# Patient Record
Sex: Male | Born: 1975 | Race: White | Hispanic: No | Marital: Married | Smoking: Current every day smoker
Health system: Southern US, Community
[De-identification: ages and names within clinical notes are randomized; demographics above are authoritative.]

## PROBLEM LIST (undated history)

## (undated) DIAGNOSIS — Z720 Tobacco use: Secondary | ICD-10-CM

## (undated) DIAGNOSIS — F1010 Alcohol abuse, uncomplicated: Secondary | ICD-10-CM

## (undated) DIAGNOSIS — D7589 Other specified diseases of blood and blood-forming organs: Secondary | ICD-10-CM

## (undated) HISTORY — DX: Other specified diseases of blood and blood-forming organs: D75.89

## (undated) HISTORY — DX: Alcohol abuse, uncomplicated: F10.10

## (undated) HISTORY — DX: Tobacco use: Z72.0

---

## 2014-08-12 ENCOUNTER — Emergency Department: Payer: Self-pay | Admitting: Emergency Medicine

## 2014-08-12 LAB — CBC
HCT: 47.3 % (ref 40.0–52.0)
HGB: 16 g/dL (ref 13.0–18.0)
MCH: 33.5 pg (ref 26.0–34.0)
MCHC: 33.9 g/dL (ref 32.0–36.0)
MCV: 99 fL (ref 80–100)
PLATELETS: 201 10*3/uL (ref 150–440)
RBC: 4.78 10*6/uL (ref 4.40–5.90)
RDW: 13.2 % (ref 11.5–14.5)
WBC: 5.2 10*3/uL (ref 3.8–10.6)

## 2014-08-12 LAB — COMPREHENSIVE METABOLIC PANEL
ANION GAP: 7 (ref 7–16)
Albumin: 4.1 g/dL (ref 3.4–5.0)
Alkaline Phosphatase: 44 U/L — ABNORMAL LOW
BILIRUBIN TOTAL: 1.1 mg/dL — AB (ref 0.2–1.0)
BUN: 14 mg/dL (ref 7–18)
Calcium, Total: 9.4 mg/dL (ref 8.5–10.1)
Chloride: 106 mmol/L (ref 98–107)
Co2: 21 mmol/L (ref 21–32)
Creatinine: 1.01 mg/dL (ref 0.60–1.30)
EGFR (Non-African Amer.): >60
Glucose: 103 mg/dL — ABNORMAL HIGH (ref 65–99)
OSMOLALITY: 269 (ref 275–301)
Potassium: 4.1 mmol/L (ref 3.5–5.1)
SGOT(AST): 26 U/L (ref 15–37)
SGPT (ALT): 49 U/L
Sodium: 134 mmol/L — ABNORMAL LOW (ref 136–145)
Total Protein: 7.6 g/dL (ref 6.4–8.2)

## 2014-08-12 LAB — TROPONIN I: Troponin-I: <0.02 ng/mL

## 2015-02-07 ENCOUNTER — Ambulatory Visit: Payer: Self-pay | Admitting: Family Medicine

## 2015-08-29 DIAGNOSIS — F1010 Alcohol abuse, uncomplicated: Secondary | ICD-10-CM | POA: Insufficient documentation

## 2015-08-29 DIAGNOSIS — D7589 Other specified diseases of blood and blood-forming organs: Secondary | ICD-10-CM | POA: Insufficient documentation

## 2015-08-29 DIAGNOSIS — Z720 Tobacco use: Secondary | ICD-10-CM | POA: Insufficient documentation

## 2015-08-31 ENCOUNTER — Ambulatory Visit (INDEPENDENT_AMBULATORY_CARE_PROVIDER_SITE_OTHER): Payer: BLUE CROSS/BLUE SHIELD | Admitting: Family Medicine

## 2015-08-31 ENCOUNTER — Encounter: Payer: Self-pay | Admitting: Family Medicine

## 2015-08-31 VITALS — BP 114/76 | HR 62 | Temp 97.4°F | Ht 72.0 in | Wt 192.0 lb

## 2015-08-31 DIAGNOSIS — Z720 Tobacco use: Secondary | ICD-10-CM

## 2015-08-31 DIAGNOSIS — M7022 Olecranon bursitis, left elbow: Secondary | ICD-10-CM | POA: Diagnosis not present

## 2015-08-31 DIAGNOSIS — B999 Unspecified infectious disease: Secondary | ICD-10-CM | POA: Diagnosis not present

## 2015-08-31 DIAGNOSIS — S60929A Unspecified superficial injury of unspecified hand, initial encounter: Secondary | ICD-10-CM

## 2015-08-31 DIAGNOSIS — S60922A Unspecified superficial injury of left hand, initial encounter: Secondary | ICD-10-CM | POA: Diagnosis not present

## 2015-08-31 DIAGNOSIS — R740 Nonspecific elevation of levels of transaminase and lactic acid dehydrogenase [LDH]: Secondary | ICD-10-CM | POA: Diagnosis not present

## 2015-08-31 DIAGNOSIS — R7401 Elevation of levels of liver transaminase levels: Secondary | ICD-10-CM

## 2015-08-31 DIAGNOSIS — Z23 Encounter for immunization: Secondary | ICD-10-CM

## 2015-08-31 DIAGNOSIS — L089 Local infection of the skin and subcutaneous tissue, unspecified: Secondary | ICD-10-CM

## 2015-08-31 NOTE — Assessment & Plan Note (Signed)
Tetanus booster offered and given today

## 2015-08-31 NOTE — Progress Notes (Signed)
BP 114/76 mmHg  Pulse 62  Temp(Src) 97.4 F (36.3 C)  Ht 6' (1.829 m)  Wt 192 lb (87.091 kg)  BMI 26.03 kg/m2  SpO2 98%   Subjective:    Patient ID: Derrick Holloway, male    DOB: August 06, 1976, 39 y.o.   MRN: [id number]  HPI: Derrick Holloway is a 39 y.o. male  Chief Complaint  Patient presents with  . Elbow Infection    It is the left elbow x 6 weeks. He went to Urgent care and they sent him to Ortho. Ortho prescribed him some meds and wanted to wait on draining it. He's finished the meds he got and still having issues with it.    He went to urgent care 6-8 weeks ago, left elbow got infected; no cut; swelled up and got hot; they took xrays and gave him medicines; did not get any better, then he saw orthopaedist and they put him on medicine; they did not want it drain it after all; pain is gone; still a little swollen and little red  Now he notices that sores are not healing; no fevers, no red streaks; gets cut on his hands and they don't heal  No known MRSA; wife works in a hospital; daughter had infection in her ankle, maybe MRSA but he's not sure Typical American diet, not a lot of vegetables; he does not take multiple vitamin  No known family hx of diabetes  Relevant past medical, surgical, family and social history reviewed and updated as indicated. Interim medical history since our last visit reviewed. Allergies and medications reviewed and updated.  Still smoking; tried chantix, made him too sick; 1 ppd to 1.5 ppd; managed to quit a few weeks Drinks alcohol but drinks less than 14 drinks per week  Review of Systems  Constitutional: Negative for unexpected weight change.  Endocrine: Negative for polydipsia, polyphagia and polyuria.    Per HPI unless specifically indicated above     Objective:    BP 114/76 mmHg  Pulse 62  Temp(Src) 97.4 F (36.3 C)  Ht 6' (1.829 m)  Wt 192 lb (87.091 kg)  BMI 26.03 kg/m2  SpO2 98%  Wt Readings from Last 3  Encounters:  08/31/15 192 lb (87.091 kg)  08/29/15 200 lb (90.719 kg)    Physical Exam  Constitutional: He appears well-developed and well-nourished. No distress.  Cardiovascular: Normal rate.   Musculoskeletal:       Left forearm: He exhibits swelling and edema. He exhibits no bony tenderness and no deformity.  Soft erythematous swelling over the left olecranon; full ROM with flexion and extension  Skin: He is not diaphoretic.  Ruddy complexion; two sores on the extensor surface of the left fingers (3rd and 5th fingers); no purulence; lesion on 3rd finger older than on 5th finger; no red streaks proximally from fingers/hands       Assessment & Plan:   Problem List Items Addressed This Visit      Musculoskeletal and Integument   Superficial injury of hand with infection - Primary    Tetanus booster offered and given today      Relevant Orders   Tdap vaccine greater than or equal to 7yo IM   CBC with Differential/Platelet   MRSA culture   Olecranon bursitis of left elbow    Improving; he has been seen twice at urgent care and once by ortho; no new medicines today; did advise he alleviate/prevent pressure against that elbow  Other   Tobacco abuse    Talked with patient about smoking cessation; avoid the electronic vape cigs that have flavorings, as those flavorings may cause bronchioloitis obliterans; discussed Chantix which he does not want to use again; offered wellbutrin, and he will think about that but did not want Rx today; he is welcome to call and I'll Rx that if he decides he would like to try it in the near future; tips for success given in the after visit summary      Recurrent infections    Discussed ddx; offered HIV testing but he did not seem interested; will get CBC and MRSA nasal swab screen and glucose on Monday; want to r/o diabetes, r/o neutropenia, r/o MRSA colonization      Relevant Orders   CBC with Differential/Platelet   MRSA culture   Elevated  serum glutamic pyruvic transaminase (SGPT) level    Recheck SGPT when he returns for labs on Monday; he reports drinking less than the max recommended amount for men      Relevant Orders   Comprehensive metabolic panel      Follow up plan: Return in about 4 days (around 09/04/2015) for fasting labs on Monday.

## 2015-08-31 NOTE — Assessment & Plan Note (Signed)
Recheck SGPT when he returns for labs on Monday; he reports drinking less than the max recommended amount for men

## 2015-08-31 NOTE — Assessment & Plan Note (Signed)
Talked with patient about smoking cessation; avoid the electronic vape cigs that have flavorings, as those flavorings may cause bronchioloitis obliterans; discussed Chantix which he does not want to use again; offered wellbutrin, and he will think about that but did not want Rx today; he is welcome to call and I'll Rx that if he decides he would like to try it in the near future; tips for success given in the after visit summary

## 2015-08-31 NOTE — Patient Instructions (Addendum)
Some morning soon, return for fasting labs You received the vaccine to protect against tetanus and diphtheria and pertussis today; the tetanus and diphtheria portions will provide protection up to ten years, and the pertussis component will give you protection against whooping cough for life Pick up a multiple vitamin and start taking one a day Think about using Wellbutrin or Zyban (generic is bupropion) for smoking cessation to help you   Smoking Cessation, Tips for Success If you are ready to quit smoking, congratulations! You have chosen to help yourself be healthier. Cigarettes bring nicotine, tar, carbon monoxide, and other irritants into your body. Your lungs, heart, and blood vessels will be able to work better without these poisons. There are many different ways to quit smoking. Nicotine gum, nicotine patches, a nicotine inhaler, or nicotine nasal spray can help with physical craving. Hypnosis, support groups, and medicines help break the habit of smoking. WHAT THINGS CAN I DO TO MAKE QUITTING EASIER?  Here are some tips to help you quit for good:  Pick a date when you will quit smoking completely. Tell all of your friends and family about your plan to quit on that date.  Do not try to slowly cut down on the number of cigarettes you are smoking. Pick a quit date and quit smoking completely starting on that day.  Throw away all cigarettes.   Clean and remove all ashtrays from your home, work, and car.  On a card, write down your reasons for quitting. Carry the card with you and read it when you get the urge to smoke.  Cleanse your body of nicotine. Drink enough water and fluids to keep your urine clear or pale yellow. Do this after quitting to flush the nicotine from your body.  Learn to predict your moods. Do not let a bad situation be your excuse to have a cigarette. Some situations in your life might tempt you into wanting a cigarette.  Never have "just one" cigarette. It leads to  wanting another and another. Remind yourself of your decision to quit.  Change habits associated with smoking. If you smoked while driving or when feeling stressed, try other activities to replace smoking. Stand up when drinking your coffee. Brush your teeth after eating. Sit in a different chair when you read the paper. Avoid alcohol while trying to quit, and try to drink fewer caffeinated beverages. Alcohol and caffeine may urge you to smoke.  Avoid foods and drinks that can trigger a desire to smoke, such as sugary or spicy foods and alcohol.  Ask people who smoke not to smoke around you.  Have something planned to do right after eating or having a cup of coffee. For example, plan to take a walk or exercise.  Try a relaxation exercise to calm you down and decrease your stress. Remember, you may be tense and nervous for the first 2 weeks after you quit, but this will pass.  Find new activities to keep your hands busy. Play with a pen, coin, or rubber band. Doodle or draw things on paper.  Brush your teeth right after eating. This will help cut down on the craving for the taste of tobacco after meals. You can also try mouthwash.   Use oral substitutes in place of cigarettes. Try using lemon drops, carrots, cinnamon sticks, or chewing gum. Keep them handy so they are available when you have the urge to smoke.  When you have the urge to smoke, try deep breathing.  Designate your  home as a nonsmoking area.  If you are a heavy smoker, ask your health care provider about a prescription for nicotine chewing gum. It can ease your withdrawal from nicotine.  Reward yourself. Set aside the cigarette money you save and buy yourself something nice.  Look for support from others. Join a support group or smoking cessation program. Ask someone at home or at work to help you with your plan to quit smoking.  Always ask yourself, "Do I need this cigarette or is this just a reflex?" Tell yourself, "Today,  I choose not to smoke," or "I do not want to smoke." You are reminding yourself of your decision to quit.  Do not replace cigarette smoking with electronic cigarettes (commonly called e-cigarettes). The safety of e-cigarettes is unknown, and some may contain harmful chemicals.  If you relapse, do not give up! Plan ahead and think about what you will do the next time you get the urge to smoke. HOW WILL I FEEL WHEN I QUIT SMOKING? You may have symptoms of withdrawal because your body is used to nicotine (the addictive substance in cigarettes). You may crave cigarettes, be irritable, feel very hungry, cough often, get headaches, or have difficulty concentrating. The withdrawal symptoms are only temporary. They are strongest when you first quit but will go away within 10-14 days. When withdrawal symptoms occur, stay in control. Think about your reasons for quitting. Remind yourself that these are signs that your body is healing and getting used to being without cigarettes. Remember that withdrawal symptoms are easier to treat than the major diseases that smoking can cause.  Even after the withdrawal is over, expect periodic urges to smoke. However, these cravings are generally short lived and will go away whether you smoke or not. Do not smoke! WHAT RESOURCES ARE AVAILABLE TO HELP ME QUIT SMOKING? Your health care provider can direct you to community resources or hospitals for support, which may include:  Group support.  Education.  Hypnosis.  Therapy. Document Released: 08/16/2004 Document Revised: 04/04/2014 Document Reviewed: 05/06/2013 [facility] Patient Information 2015 [place], [place]. This information is not intended to replace advice given to you by your health care provider. Make sure you discuss any questions you have with your health care provider.

## 2015-08-31 NOTE — Assessment & Plan Note (Signed)
Improving; he has been seen twice at urgent care and once by ortho; no new medicines today; did advise he alleviate/prevent pressure against that elbow

## 2015-08-31 NOTE — Assessment & Plan Note (Signed)
Discussed ddx; offered HIV testing but he did not seem interested; will get CBC and MRSA nasal swab screen and glucose on Monday; want to r/o diabetes, r/o neutropenia, r/o MRSA colonization

## 2015-09-04 ENCOUNTER — Other Ambulatory Visit: Payer: Self-pay | Admitting: Family Medicine

## 2015-09-04 ENCOUNTER — Other Ambulatory Visit: Payer: BLUE CROSS/BLUE SHIELD

## 2015-09-04 DIAGNOSIS — B999 Unspecified infectious disease: Secondary | ICD-10-CM

## 2015-09-04 NOTE — Assessment & Plan Note (Signed)
New order for MRSA swab entered

## 2015-09-05 ENCOUNTER — Encounter: Payer: Self-pay | Admitting: Family Medicine

## 2015-09-05 LAB — COMPREHENSIVE METABOLIC PANEL
A/G RATIO: 1.7 (ref 1.1–2.5)
ALK PHOS: 47 IU/L (ref 39–117)
ALT: 32 IU/L (ref 0–44)
AST: 27 IU/L (ref 0–40)
Albumin: 4.3 g/dL (ref 3.5–5.5)
BUN/Creatinine Ratio: 18 (ref 8–19)
BUN: 19 mg/dL (ref 6–20)
Bilirubin Total: 0.9 mg/dL (ref 0.0–1.2)
CO2: 22 mmol/L (ref 18–29)
CREATININE: 1.08 mg/dL (ref 0.76–1.27)
Calcium: 9.6 mg/dL (ref 8.7–10.2)
Chloride: 100 mmol/L (ref 97–108)
GFR calc Af Amer: 102 mL/min/{1.73_m2} (ref >59)
GFR calc non Af Amer: 88 mL/min/{1.73_m2} (ref >59)
GLOBULIN, TOTAL: 2.5 g/dL (ref 1.5–4.5)
Glucose: 99 mg/dL (ref 65–99)
POTASSIUM: 4.7 mmol/L (ref 3.5–5.2)
SODIUM: 139 mmol/L (ref 134–144)
Total Protein: 6.8 g/dL (ref 6.0–8.5)

## 2015-09-05 LAB — CBC WITH DIFFERENTIAL/PLATELET
Basophils Absolute: 0 10*3/uL (ref 0.0–0.2)
Basos: 0 %
EOS (ABSOLUTE): 0.1 10*3/uL (ref 0.0–0.4)
EOS: 2 %
HEMATOCRIT: 45.6 % (ref 37.5–51.0)
Hemoglobin: 16.4 g/dL (ref 12.6–17.7)
Immature Grans (Abs): 0 10*3/uL (ref 0.0–0.1)
Immature Granulocytes: 0 %
LYMPHS ABS: 1.1 10*3/uL (ref 0.7–3.1)
Lymphs: 24 %
MCH: 34.2 pg — ABNORMAL HIGH (ref 26.6–33.0)
MCHC: 36 g/dL — AB (ref 31.5–35.7)
MCV: 95 fL (ref 79–97)
MONOS ABS: 0.7 10*3/uL (ref 0.1–0.9)
Monocytes: 15 %
NEUTROS PCT: 59 %
Neutrophils Absolute: 2.7 10*3/uL (ref 1.4–7.0)
PLATELETS: 211 10*3/uL (ref 150–379)
RBC: 4.79 x10E6/uL (ref 4.14–5.80)
RDW: 13.2 % (ref 12.3–15.4)
WBC: 4.6 10*3/uL (ref 3.4–10.8)

## 2015-09-07 LAB — MRSA CULTURE: MRSA Screen: NEGATIVE

## 2015-11-23 ENCOUNTER — Other Ambulatory Visit: Payer: Self-pay | Admitting: Family Medicine

## 2015-11-23 MED ORDER — CITALOPRAM HYDROBROMIDE 10 MG PO TABS: 10.0000 mg | ORAL_TABLET | Freq: Every day | ORAL | Status: AC

## 2015-11-23 NOTE — Telephone Encounter (Signed)
Thank you for the note; Rx sent

## 2015-11-23 NOTE — Telephone Encounter (Signed)
Pt called stated he needs a refill on Citaloeram. Pt stated this was prescribed by another physician but pt wants to know if Dr. Sanda Klein can call in refills. Pt instructed he should contact prescribing doctor as well. Please call pt with issues. Pharm is [facility] in [place]. Thanks.

## 2015-11-23 NOTE — Telephone Encounter (Signed)
Routing to provider. Looking at [place], it looks like Dr. Ubaldo Glassing (cardiology) had refilled it.

## 2016-03-06 DIAGNOSIS — L570 Actinic keratosis: Secondary | ICD-10-CM

## 2016-03-06 HISTORY — DX: Actinic keratosis: L57.0

## 2016-09-24 IMAGING — CR DG CHEST 2V
1 series · 2 of 2 positions shown · non-contrast
Comparison: None.

CLINICAL DATA: Cough for a week, intermittent shortness of breath
and wheezing, smoking history

EXAM:
CHEST  2 VIEW

[Series 1: pa · 0.17mm/px · 2 of 2 slices shown]
[im 1/2]
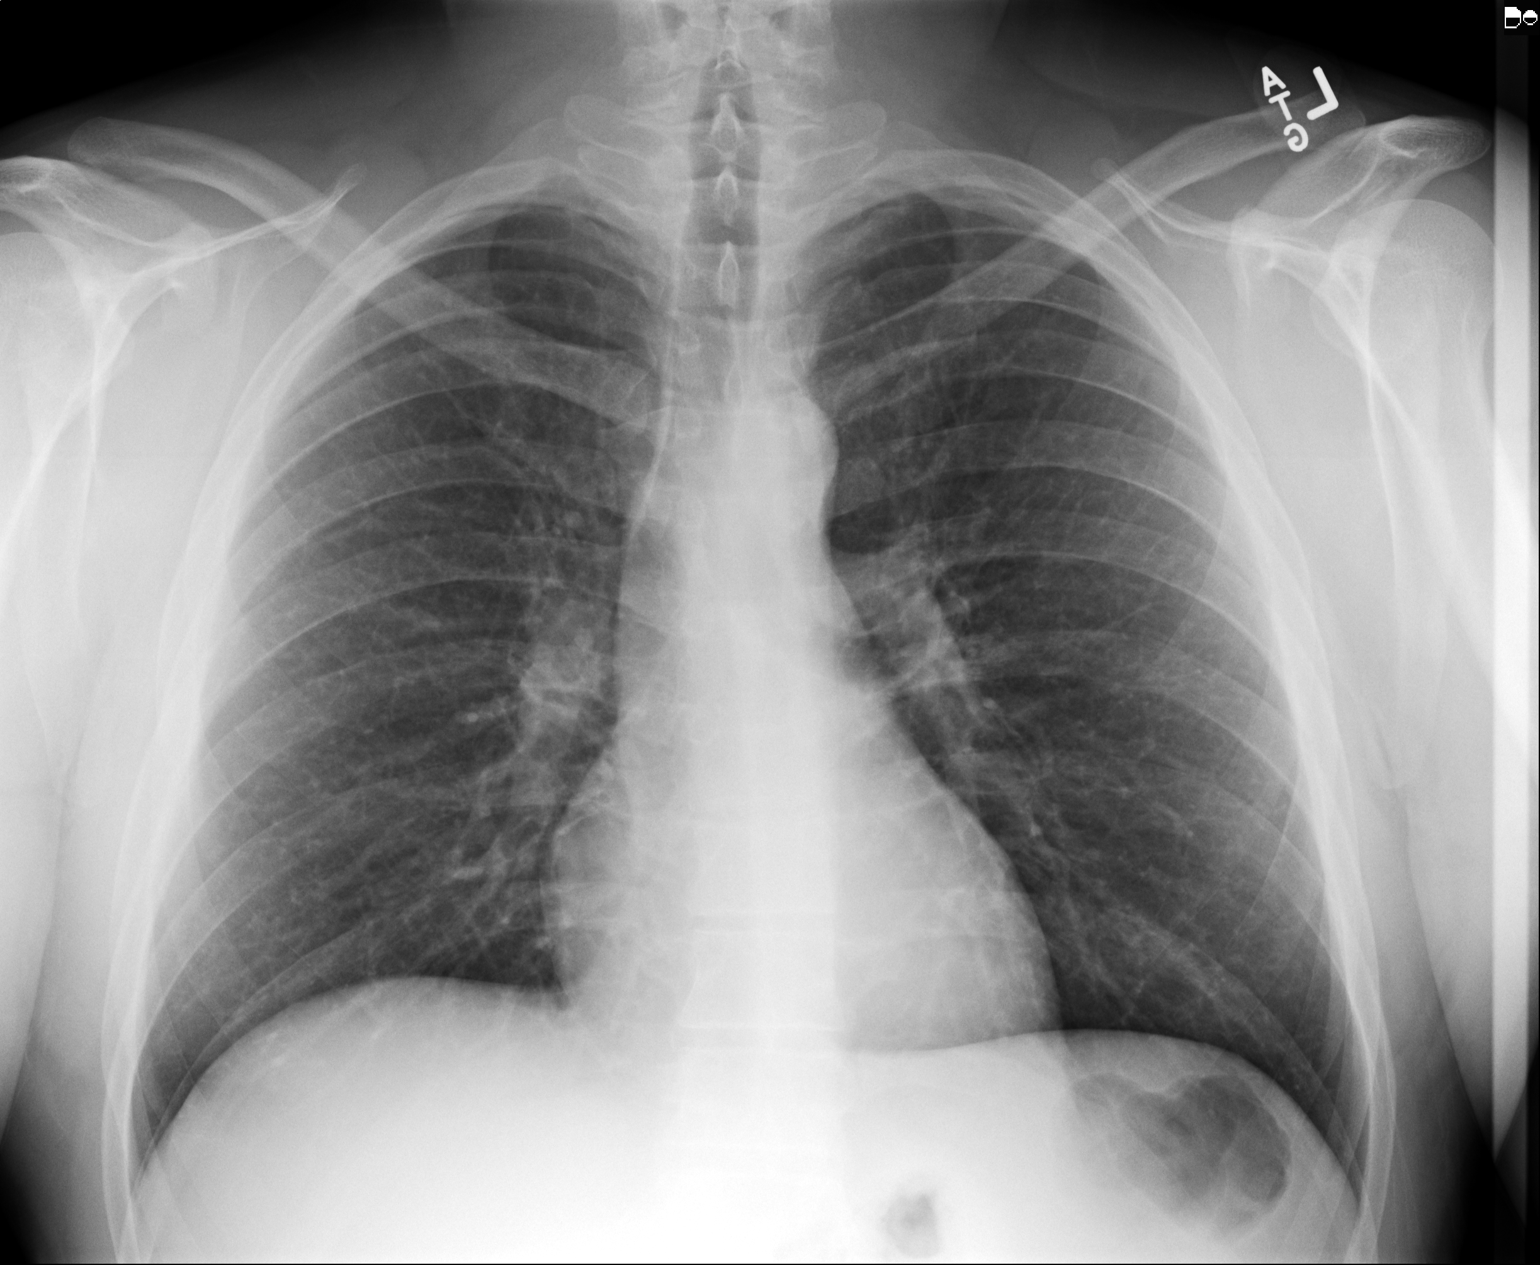
[im 2/2]
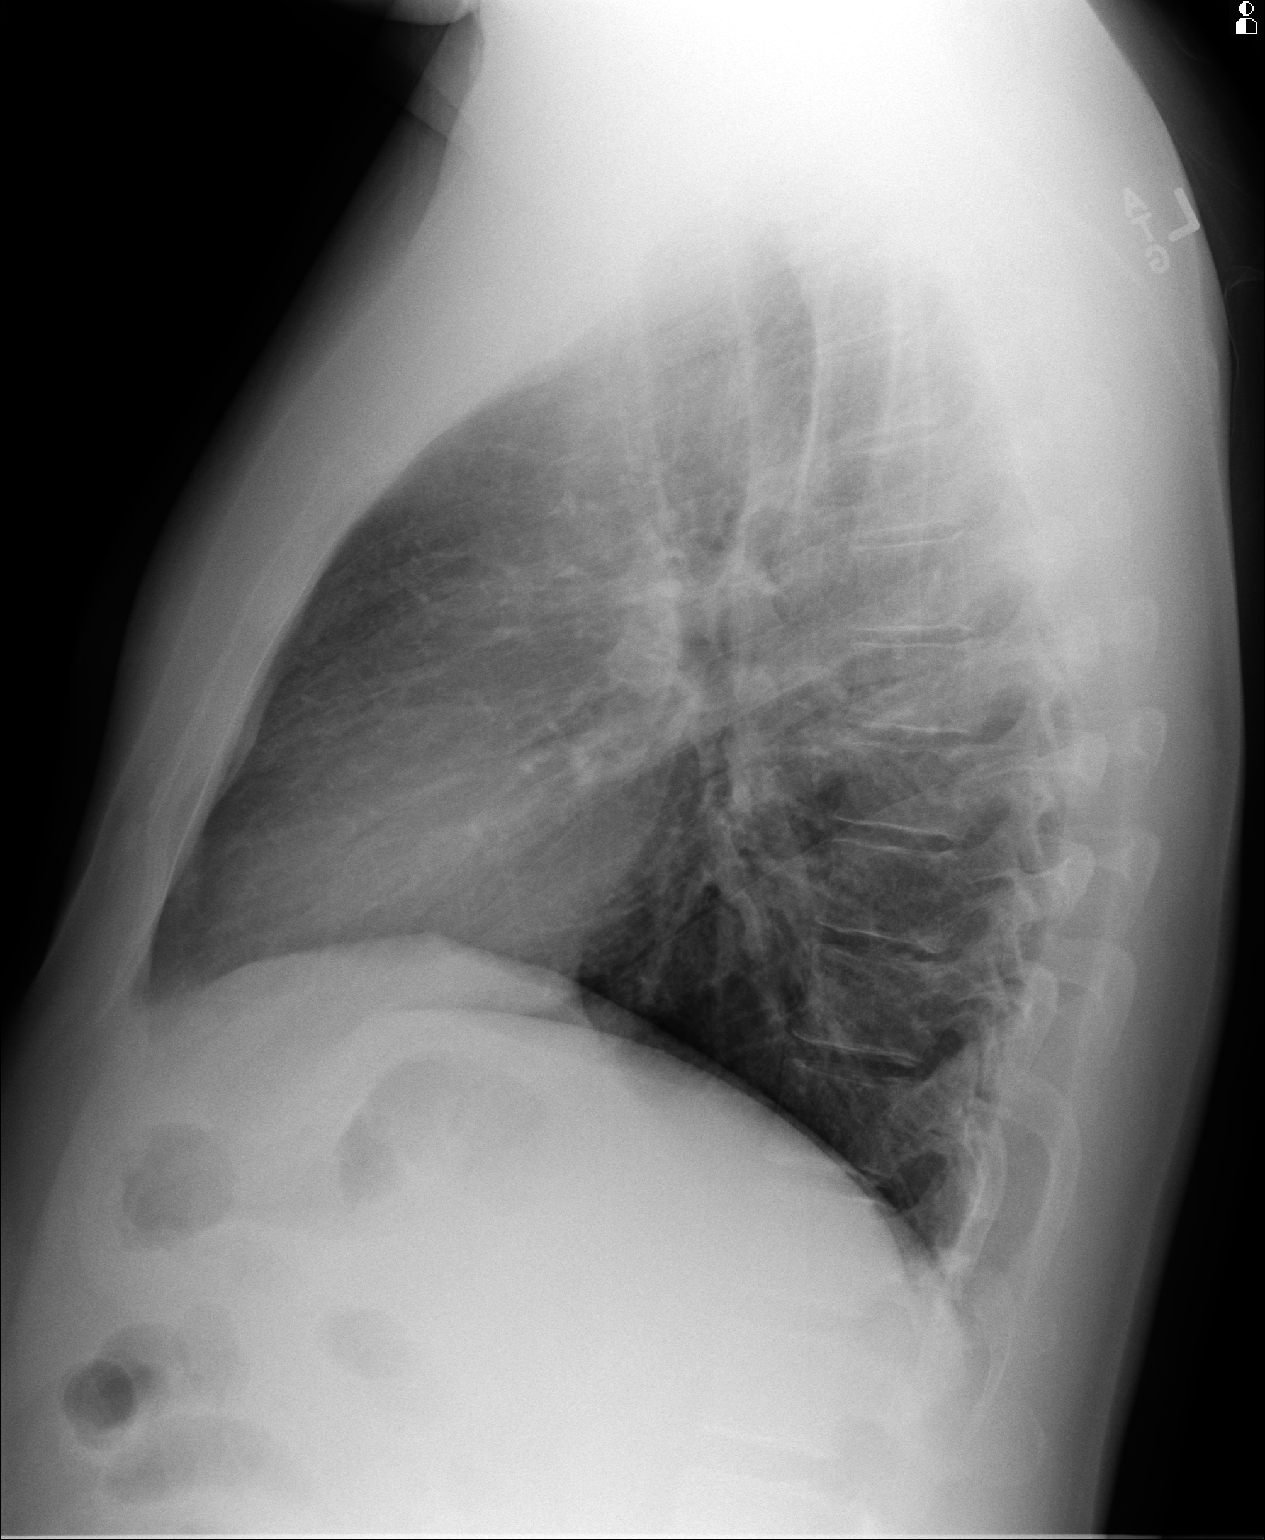

[2 of 2 positions shown; findings below may reference images not displayed]

FINDINGS: No infiltrate or effusion is seen. Mediastinal and hilar contours
are unremarkable. The heart is within normal limits in size. No bony
abnormality is seen.
IMPRESSION: No active cardiopulmonary disease.

## 2020-08-16 ENCOUNTER — Ambulatory Visit: Payer: BC Managed Care – PPO | Admitting: Dermatology

## 2020-08-16 ENCOUNTER — Other Ambulatory Visit: Payer: Self-pay

## 2020-08-16 ENCOUNTER — Encounter: Payer: Self-pay | Admitting: Dermatology

## 2020-08-16 DIAGNOSIS — L821 Other seborrheic keratosis: Secondary | ICD-10-CM | POA: Diagnosis not present

## 2020-08-16 DIAGNOSIS — L578 Other skin changes due to chronic exposure to nonionizing radiation: Secondary | ICD-10-CM | POA: Diagnosis not present

## 2020-08-16 DIAGNOSIS — L570 Actinic keratosis: Secondary | ICD-10-CM | POA: Diagnosis not present

## 2020-08-16 NOTE — Progress Notes (Deleted)
   New Patient Visit  Subjective  Derrick Holloway is a 44 y.o. male who presents for the following: Actinic Keratosis (Check a few pink scaly places on his face, pt report hx of Aks  ).   The following portions of the chart were reviewed this encounter and updated as appropriate:      Review of Systems:  No other skin or systemic complaints except as noted in HPI or Assessment and Plan.  Objective  Well appearing patient in no apparent distress; mood and affect are within normal limits.  A focused examination was performed including face. Relevant physical exam findings are noted in the Assessment and Plan.  Objective  Head - Anterior (Face) (3): Erythematous thin papules/macules with gritty scale.    Assessment & Plan  AK (actinic keratosis) (3) Head - Anterior (Face)  Recomment Elta Md sun protection daily, wear hats when working outside   Destruction of lesion - Head - Anterior (Face) Complexity: simple   Destruction method: cryotherapy   Informed consent: discussed and consent obtained   Timeout:  patient name, date of birth, surgical site, and procedure verified Lesion destroyed using liquid nitrogen: Yes   Region frozen until ice ball extended beyond lesion: Yes   Outcome: patient tolerated procedure well with no complications   Post-procedure details: wound care instructions given   Actinic Damage - diffuse scaly erythematous macules with underlying dyspigmentation - Recommend daily broad spectrum sunscreen SPF 30+ to sun-exposed areas, reapply every 2 hours as needed.  - Call for new or changing lesions.  Seborrheic Keratoses - Stuck-on, waxy, tan-brown papules and plaques  - Discussed benign etiology and prognosis. - Observe - Call for any changes  Return in about 1 year (around 08/16/2021) for Aks .  IMarye Round, CMA, am acting as scribe for Sarina Ser, MD .

## 2020-08-16 NOTE — Patient Instructions (Signed)
Recommend daily broad spectrum sunscreen SPF 30+ to sun-exposed areas, reapply every 2 hours as needed. Call for new or changing lesions.  Cryotherapy Aftercare  . Wash gently with soap and water everyday.   . Apply Vaseline and Band-Aid daily until healed.  

## 2020-08-18 NOTE — Progress Notes (Signed)
   Follow-Up Visit   Subjective  Derrick Holloway is a 44 y.o. male who presents for the following: Actinic Keratosis (Check a few pink scaly places on his face, pt report hx of Aks  ).  The following portions of the chart were reviewed this encounter and updated as appropriate:  Tobacco  Allergies  Meds  Problems  Med Hx  Surg Hx  Fam Hx     Review of Systems:  No other skin or systemic complaints except as noted in HPI or Assessment and Plan.  Objective  Well appearing patient in no apparent distress; mood and affect are within normal limits.  A focused examination was performed including face. Relevant physical exam findings are noted in the Assessment and Plan.  Objective  Head - Anterior (Face) (3): Erythematous thin papules/macules with gritty scale.    Assessment & Plan  AK (actinic keratosis) (3) Head - Anterior (Face)  Recomment Elta Md sun protection daily, wear hats when working outside   Destruction of lesion - Head - Anterior (Face) Complexity: simple   Destruction method: cryotherapy   Informed consent: discussed and consent obtained   Timeout:  patient name, date of birth, surgical site, and procedure verified Lesion destroyed using liquid nitrogen: Yes   Region frozen until ice ball extended beyond lesion: Yes   Outcome: patient tolerated procedure well with no complications   Post-procedure details: wound care instructions given    Seborrheic Keratoses - Stuck-on, waxy, tan-brown papules and plaques  - Discussed benign etiology and prognosis. - Observe - Call for any changes  Actinic Damage - diffuse scaly erythematous macules with underlying dyspigmentation - Recommend daily broad spectrum sunscreen SPF 30+ to sun-exposed areas, reapply every 2 hours as needed.  - Call for new or changing lesions.  Return in about 1 year (around 08/16/2021) for Aks .  IMarye Round, CMA, am acting as scribe for Sarina Ser, MD .  Documentation: I  have reviewed the above documentation for accuracy and completeness, and I agree with the above.  Sarina Ser, MD

## 2020-08-21 ENCOUNTER — Encounter: Payer: Self-pay | Admitting: Dermatology

## 2021-08-22 ENCOUNTER — Other Ambulatory Visit: Payer: Self-pay

## 2021-08-22 ENCOUNTER — Ambulatory Visit: Payer: BC Managed Care – PPO | Admitting: Dermatology

## 2021-08-22 ENCOUNTER — Encounter: Payer: Self-pay | Admitting: Dermatology

## 2021-08-22 DIAGNOSIS — L578 Other skin changes due to chronic exposure to nonionizing radiation: Secondary | ICD-10-CM

## 2021-08-22 DIAGNOSIS — D1800 Hemangioma unspecified site: Secondary | ICD-10-CM

## 2021-08-22 DIAGNOSIS — Z872 Personal history of diseases of the skin and subcutaneous tissue: Secondary | ICD-10-CM | POA: Diagnosis not present

## 2021-08-22 DIAGNOSIS — D229 Melanocytic nevi, unspecified: Secondary | ICD-10-CM

## 2021-08-22 DIAGNOSIS — L570 Actinic keratosis: Secondary | ICD-10-CM

## 2021-08-22 DIAGNOSIS — Z1283 Encounter for screening for malignant neoplasm of skin: Secondary | ICD-10-CM

## 2021-08-22 DIAGNOSIS — L814 Other melanin hyperpigmentation: Secondary | ICD-10-CM

## 2021-08-22 MED ORDER — FLUOROURACIL 5 % EX CREA: TOPICAL_CREAM | CUTANEOUS | 1 refills | Status: DC

## 2021-08-22 NOTE — Progress Notes (Signed)
Follow-Up Visit   Subjective  Derrick Holloway is a 45 y.o. male who presents for the following: Annual Exam (Here for UBSE. Hx of AK's. No personal Hx of skin cancer. Lesions on face and left abdomen/flank. ). The patient presents for Upper Body Skin Exam (UBSE) for skin cancer screening and mole check.  The following portions of the chart were reviewed this encounter and updated as appropriate:  Tobacco  Allergies  Meds  Problems  Med Hx  Surg Hx  Fam Hx     Review of Systems: No other skin or systemic complaints except as noted in HPI or Assessment and Plan.  Objective  Well appearing patient in no apparent distress; mood and affect are within normal limits.  All skin waist up examined.  face Erythematous thin papules/macules with gritty scale.   Assessment & Plan  AK (actinic keratosis) face  Actinic keratoses are precancerous spots that appear secondary to cumulative UV radiation exposure/sun exposure over time. They are chronic with expected duration over 1 year. A portion of actinic keratoses will progress to squamous cell carcinoma of the skin. It is not possible to reliably predict which spots will progress to skin cancer and so treatment is recommended to prevent development of skin cancer.  Recommend daily broad spectrum sunscreen SPF 30+ to sun-exposed areas, reapply every 2 hours as needed.  Recommend staying in the shade or wearing long sleeves, sun glasses (UVA+UVB protection) and wide brim hats (4-inch brim around the entire circumference of the hat). Call for new or changing lesions.  Start 5-fluorouracil/calcipotriene cream twice a day for 7 days to affected areas including temples, cheeks and nose. Prescription sent to [facility]. Patient provided with contact information for pharmacy and advised the pharmacy will mail the prescription to their home. Patient provided with handout reviewing treatment course and side effects and advised to call or  message [place] on MyChart with any concerns.   Start Tx in mid October.  Actinic Damage - Severe, confluent actinic changes with pre-cancerous actinic keratoses  - Severe, chronic, not at goal, secondary to cumulative UV radiation exposure over time - diffuse scaly erythematous macules and papules with underlying dyspigmentation - Discussed Prescription "Field Treatment" for Severe, Chronic Confluent Actinic Changes with Pre-Cancerous Actinic Keratoses Field treatment involves treatment of an entire area of skin that has confluent Actinic Changes (Sun/ Ultraviolet light damage) and PreCancerous Actinic Keratoses by method of PhotoDynamic Therapy (PDT) and/or prescription Topical Chemotherapy agents such as 5-fluorouracil, 5-fluorouracil/calcipotriene, and/or imiquimod.  The purpose is to decrease the number of clinically evident and subclinical PreCancerous lesions to prevent progression to development of skin cancer by chemically destroying early precancer changes that may or may not be visible.  It has been shown to reduce the risk of developing skin cancer in the treated area. As a result of treatment, redness, scaling, crusting, and open sores may occur during treatment course. One or more than one of these methods may be used and may have to be used several times to control, suppress and eliminate the PreCancerous changes. Discussed treatment course, expected reaction, and possible side effects. - Recommend daily broad spectrum sunscreen SPF 30+ to sun-exposed areas, reapply every 2 hours as needed.  - Staying in the shade or wearing long sleeves, sun glasses (UVA+UVB protection) and wide brim hats (4-inch brim around the entire circumference of the hat) are also recommended. - Call for new or changing lesions.    fluorouracil (EFUDEX) 5 % cream - face Apply  twice daily to temples, nose, cheeks for 7 days  Skin cancer screening  Lentigines - Scattered tan macules - Due to sun exposure -  Benign-appearing, observe - Recommend daily broad spectrum sunscreen SPF 30+ to sun-exposed areas, reapply every 2 hours as needed. - Call for any changes   Melanocytic Nevi - Tan-brown and/or pink-flesh-colored symmetric macules and papules - Benign appearing on exam today - Observation - Call clinic for new or changing moles - Recommend daily use of broad spectrum spf 30+ sunscreen to sun-exposed areas.   Hemangiomas - Red papules - Discussed benign nature - Observe - Call for any changes  Actinic Damage - Chronic condition, secondary to cumulative UV/sun exposure - diffuse scaly erythematous macules with underlying dyspigmentation - Recommend daily broad spectrum sunscreen SPF 30+ to sun-exposed areas, reapply every 2 hours as needed.  - Staying in the shade or wearing long sleeves, sun glasses (UVA+UVB protection) and wide brim hats (4-inch brim around the entire circumference of the hat) are also recommended for sun protection.  - Call for new or changing lesions.  Skin cancer screening performed today.  Return for AK recheck 6 months.Loraine Maple, CMA, am acting as scribe for Sarina Ser, MD. Documentation: I have reviewed the above documentation for accuracy and completeness, and I agree with the above.  Sarina Ser, MD

## 2021-08-22 NOTE — Patient Instructions (Addendum)
Start 5-fluorouracil/calcipotriene cream twice a day for 7 days to affected areas including temples, nose and cheeks. Prescription sent to [facility]. Patient provided with contact information for pharmacy and advised the pharmacy will mail the prescription to their home. Patient provided with handout reviewing treatment course and side effects and advised to call or message [place] on MyChart with any concerns.   Start treatment in mid October.   5-Fluorouracil/Calcipotriene Patient Education   Actinic keratoses are the dry, red scaly spots on the skin caused by sun damage. A portion of these spots can turn into skin cancer with time, and treating them can help prevent development of skin cancer.   Treatment of these spots requires removal of the defective skin cells. There are various ways to remove actinic keratoses, including freezing with liquid nitrogen, treatment with creams, or treatment with a blue light procedure in the office.   5-fluorouracil cream is a topical cream used to treat actinic keratoses. It works by interfering with the growth of abnormal fast-growing skin cells, such as actinic keratoses. These cells peel off and are replaced by healthy ones.   5-fluorouracil/calcipotriene is a combination of the 5-fluorouracil cream with a vitamin D analog cream called calcipotriene. The calcipotriene alone does not treat actinic keratoses. However, when it is combined with 5-fluorouracil, it helps the 5-fluorouracil treat the actinic keratoses much faster so that the same results can be achieved with a much shorter treatment time.  INSTRUCTIONS FOR 5-FLUOROURACIL/CALCIPOTRIENE CREAM:   5-fluorouracil/calcipotriene cream typically only needs to be used for 4-7 days. A thin layer should be applied twice a day to the treatment areas recommended by your physician.   If your physician prescribed you separate tubes of 5-fluourouracil and calcipotriene, apply a thin layer of 5-fluorouracil  followed by a thin layer of calcipotriene.   Avoid contact with your eyes, nostrils, and mouth. Do not use 5-fluorouracil/calcipotriene cream on infected or open wounds.   You will develop redness, irritation and some crusting at areas where you have pre-cancer damage/actinic keratoses. IF YOU DEVELOP PAIN, BLEEDING, OR SIGNIFICANT CRUSTING, STOP THE TREATMENT EARLY - you have already gotten a good response and the actinic keratoses should clear up well.  Wash your hands after applying 5-fluorouracil 5% cream on your skin.   A moisturizer or sunscreen with a minimum SPF 30 should be applied each morning.   Once you have finished the treatment, you can apply a thin layer of Vaseline twice a day to irritated areas to soothe and calm the areas more quickly. If you experience significant discomfort, contact your physician.  For some patients it is necessary to repeat the treatment for best results.  SIDE EFFECTS: When using 5-fluorouracil/calcipotriene cream, you may have mild irritation, such as redness, dryness, swelling, or a mild burning sensation. This usually resolves within 2 weeks. The more actinic keratoses you have, the more redness and inflammation you can expect during treatment. Eye irritation has been reported rarely. If this occurs, please let us know.  If you have any trouble using this cream, please call the office. If you have any other questions about this information, please do not hesitate to ask me before you leave the office.  If you have any questions or concerns for your doctor, please call our main line at [phone] and press option 4 to reach your doctor's medical assistant. If no one answers, please leave a voicemail as directed and we will return your call as soon as possible. Messages left after 4 pm  will be answered the following business day.   You may also send [place] a message via [place]. We typically respond to MyChart messages within 1-2 business days.  For  prescription refills, please ask your pharmacy to contact our office. Our fax number is [phone].  If you have an urgent issue when the clinic is closed that cannot wait until the next business day, you can page your doctor at the number below.    Please note that while we do our best to be available for urgent issues outside of office hours, we are not available 24/7.   If you have an urgent issue and are unable to reach [place], you may choose to seek medical care at your doctor's office, retail clinic, urgent care center, or emergency room.  If you have a medical emergency, please immediately call 911 or go to the emergency department.  Pager Numbers  - Dr. Nehemiah Massed: [phone]  - Dr. Laurence Ferrari: [phone]  - Dr. Nicole Kindred: [phone]  In the event of inclement weather, please call our main line at [phone] for an update on the status of any delays or closures.  Dermatology Medication Tips: Please keep the boxes that topical medications come in in order to help keep track of the instructions about where and how to use these. Pharmacies typically print the medication instructions only on the boxes and not directly on the medication tubes.   If your medication is too expensive, please contact our office at [phone] option 4 or send [place] a message through [place].   We are unable to tell what your co-pay for medications will be in advance as this is different depending on your insurance coverage. However, we may be able to find a substitute medication at lower cost or fill out paperwork to get insurance to cover a needed medication.   If a prior authorization is required to get your medication covered by your insurance company, please allow [place] 1-2 business days to complete this process.  Drug prices often vary depending on where the prescription is filled and some pharmacies may offer cheaper prices.  The website www.goodrx.com contains coupons for medications through different  pharmacies. The prices here do not account for what the cost may be with help from insurance (it may be cheaper with your insurance), but the website can give you the price if you did not use any insurance.  - You can print the associated coupon and take it with your prescription to the pharmacy.  - You may also stop by our office during regular business hours and pick up a GoodRx coupon card.  - If you need your prescription sent electronically to a different pharmacy, notify our office through [facility] or by phone at [phone] option 4.

## 2021-08-27 ENCOUNTER — Encounter: Payer: Self-pay | Admitting: Dermatology

## 2022-02-21 ENCOUNTER — Other Ambulatory Visit: Payer: Self-pay

## 2022-02-21 ENCOUNTER — Ambulatory Visit: Payer: BC Managed Care – PPO | Admitting: Dermatology

## 2022-02-21 DIAGNOSIS — T148XXA Other injury of unspecified body region, initial encounter: Secondary | ICD-10-CM

## 2022-02-21 DIAGNOSIS — L0100 Impetigo, unspecified: Secondary | ICD-10-CM | POA: Diagnosis not present

## 2022-02-21 DIAGNOSIS — D1800 Hemangioma unspecified site: Secondary | ICD-10-CM | POA: Diagnosis not present

## 2022-02-21 DIAGNOSIS — L578 Other skin changes due to chronic exposure to nonionizing radiation: Secondary | ICD-10-CM | POA: Diagnosis not present

## 2022-02-21 DIAGNOSIS — S0031XA Abrasion of nose, initial encounter: Secondary | ICD-10-CM | POA: Diagnosis not present

## 2022-02-21 DIAGNOSIS — Z872 Personal history of diseases of the skin and subcutaneous tissue: Secondary | ICD-10-CM

## 2022-02-21 MED ORDER — MUPIROCIN 2 % EX OINT: 1.0000 "application " | TOPICAL_OINTMENT | Freq: Every day | CUTANEOUS | 2 refills | Status: DC

## 2022-02-21 NOTE — Patient Instructions (Signed)

## 2022-02-21 NOTE — Progress Notes (Signed)
? ?  Follow-Up Visit ?  ?Subjective  ?Derrick Holloway is a 46 y.o. male who presents for the following: Follow-up (Patient here today for 6 month AK follow up. Patient used 5FU/calcipotriene twice daily for 7 days to face in October. Patient advises he did have some scabbing.  ?The patient has spots, moles and lesions to be evaluated, some may be new or changing and the patient has concerns that these could be cancer. ? ?The following portions of the chart were reviewed this encounter and updated as appropriate:  ? Tobacco  Allergies  Meds  Problems  Med Hx  Surg Hx  Fam Hx   ?  ?Review of Systems:  No other skin or systemic complaints except as noted in HPI or Assessment and Plan. ? ?Objective  ?Well appearing patient in no apparent distress; mood and affect are within normal limits. ? ?A focused examination was performed including face. Relevant physical exam findings are noted in the Assessment and Plan. ? ?left anterior nare ?Fissure with possible impetigo ? ? ?Assessment & Plan  ?Excoriation/fissure with impetigo ?left anterior nare ?Start mupirocin to nares daily at bedtime x 2 weeks or until clear.  ? ?mupirocin ointment (BACTROBAN) 2 % - left anterior nare ?Apply 1 application. topically daily. At bedtime to inside of nose x 2 weeks or until clear ? ?Hemangiomas ?- Red papules ?- Discussed benign nature ?- Observe ?- Call for any changes ? ?Actinic Damage ?- chronic, secondary to cumulative UV radiation exposure/sun exposure over time ?- diffuse scaly erythematous macules with underlying dyspigmentation ?- Recommend daily broad spectrum sunscreen SPF 30+ to sun-exposed areas, reapply every 2 hours as needed.  ?- Recommend staying in the shade or wearing long sleeves, sun glasses (UVA+UVB protection) and wide brim hats (4-inch brim around the entire circumference of the hat). ?- Call for new or changing lesions. ? ?History of PreCancerous Actinic Keratosis  ?- site(s) of PreCancerous Actinic  Keratosis clear today. ?- these may recur and new lesions may form requiring treatment to prevent transformation into skin cancer ?- observe for new or changing spots and contact [place] for appointment if occur ?- photoprotection with sun protective clothing; sunglasses and broad spectrum sunscreen with SPF of at least 30 + and frequent self skin exams recommended ?- yearly exams by a dermatologist recommended for persons with history of PreCancerous Actinic Keratoses ? ?Return in about 1 year (around 02/22/2023) for TBSE. ? ?Graciella Belton, RMA, am acting as scribe for Sarina Ser, MD . ?Documentation: I have reviewed the above documentation for accuracy and completeness, and I agree with the above. ? ?Sarina Ser, MD ? ?

## 2022-02-25 ENCOUNTER — Encounter: Payer: Self-pay | Admitting: Dermatology

## 2022-05-04 ENCOUNTER — Emergency Department: Payer: BC Managed Care – PPO

## 2022-05-04 ENCOUNTER — Other Ambulatory Visit: Payer: Self-pay

## 2022-05-04 DIAGNOSIS — W268XXA Contact with other sharp object(s), not elsewhere classified, initial encounter: Secondary | ICD-10-CM | POA: Diagnosis not present

## 2022-05-04 DIAGNOSIS — S99922A Unspecified injury of left foot, initial encounter: Secondary | ICD-10-CM | POA: Diagnosis present

## 2022-05-04 DIAGNOSIS — Y9355 Activity, bike riding: Secondary | ICD-10-CM | POA: Diagnosis not present

## 2022-05-04 DIAGNOSIS — S91312A Laceration without foreign body, left foot, initial encounter: Secondary | ICD-10-CM | POA: Insufficient documentation

## 2022-05-04 DIAGNOSIS — Z23 Encounter for immunization: Secondary | ICD-10-CM | POA: Diagnosis not present

## 2022-05-04 MED ORDER — OXYCODONE-ACETAMINOPHEN 5-325 MG PO TABS
1.0000 | ORAL_TABLET | Freq: Once | ORAL | Status: AC
  Administered 2022-05-04: 1 via ORAL
  Filled 2022-05-04: qty 1
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
## 2022-05-05 ENCOUNTER — Emergency Department
Admission: EM | Admit: 2022-05-05 | Discharge: 2022-05-05 | Disposition: A | Discharge status: Home / Self Care | Payer: BC Managed Care – PPO | Attending: Emergency Medicine | Admitting: Emergency Medicine

## 2022-05-05 DIAGNOSIS — S91312A Laceration without foreign body, left foot, initial encounter: Secondary | ICD-10-CM

## 2022-05-05 MED ORDER — CEPHALEXIN 500 MG PO CAPS
500.0000 mg | ORAL_CAPSULE | Freq: Once | ORAL | Status: AC
  Administered 2022-05-05: 500 mg via ORAL
  Filled 2022-05-05: qty 1

## 2022-05-05 MED ORDER — TETANUS-DIPHTH-ACELL PERTUSSIS 5-2.5-18.5 LF-MCG/0.5 IM SUSY
0.5000 mL | PREFILLED_SYRINGE | Freq: Once | INTRAMUSCULAR | Status: AC
  Administered 2022-05-05: 0.5 mL via INTRAMUSCULAR
  Filled 2022-05-05: qty 0.5

## 2022-05-05 MED ORDER — CEFADROXIL 500 MG PO CAPS: 500.0000 mg | ORAL_CAPSULE | Freq: Two times a day (BID) | ORAL | 0 refills | Status: AC

## 2022-05-05 NOTE — ED Notes (Signed)
Pt's wound irrigated. Pt sitting on stretcher after receiving medications. Provider aware pt's foor irrigated.
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
## 2022-05-05 NOTE — Discharge Instructions (Signed)
As we discussed, we put on 1 clip to hold one of the longer lacerations in place, but the area of injury needs to heal from the inside out.  Please keep it clean and dry.  Use an antibiotic ointment such as bacitracin and a small amount to keep it moist so that the dressing does not stick to it.  You can then cover it with a nonadherent dressing or gauze.  Try not to get the antibiotic ointment on the Derma-Clip, or it may loosen the adhesive.  You can shower like normal but be very gentle cleansing the area and then cover it back up again with antibiotic ointment and gauze.  If the derma clip falls off, that is okay, it will continue to heal from the inside.  Take the full course of antibiotics prescribed to reduce your risk of infection.  We recommend that you call the office of Dr. Cleda Mccreedy with podiatry to schedule the next available follow-up appointment for a wound check and to determine if any additional revision of the wound is recommended.    Return to the emergency department if you develop new or worsening symptoms that concern you.

## 2023-02-24 ENCOUNTER — Ambulatory Visit: Payer: BC Managed Care – PPO | Admitting: Dermatology

## 2023-07-02 ENCOUNTER — Ambulatory Visit: Payer: BC Managed Care – PPO | Admitting: Dermatology

## 2023-12-20 IMAGING — CR DG ANKLE COMPLETE 3+V*L*
1 series · 3 of 3 positions shown · non-contrast
Comparison: None Available.

CLINICAL DATA: Left ankle injury.  Laceration to heel.

EXAM:
LEFT ANKLE COMPLETE - 3+ VIEW

[Series 1: dg ankle complete left · 0.14mm/px · 3 of 3 slices shown]
[im 1/3]
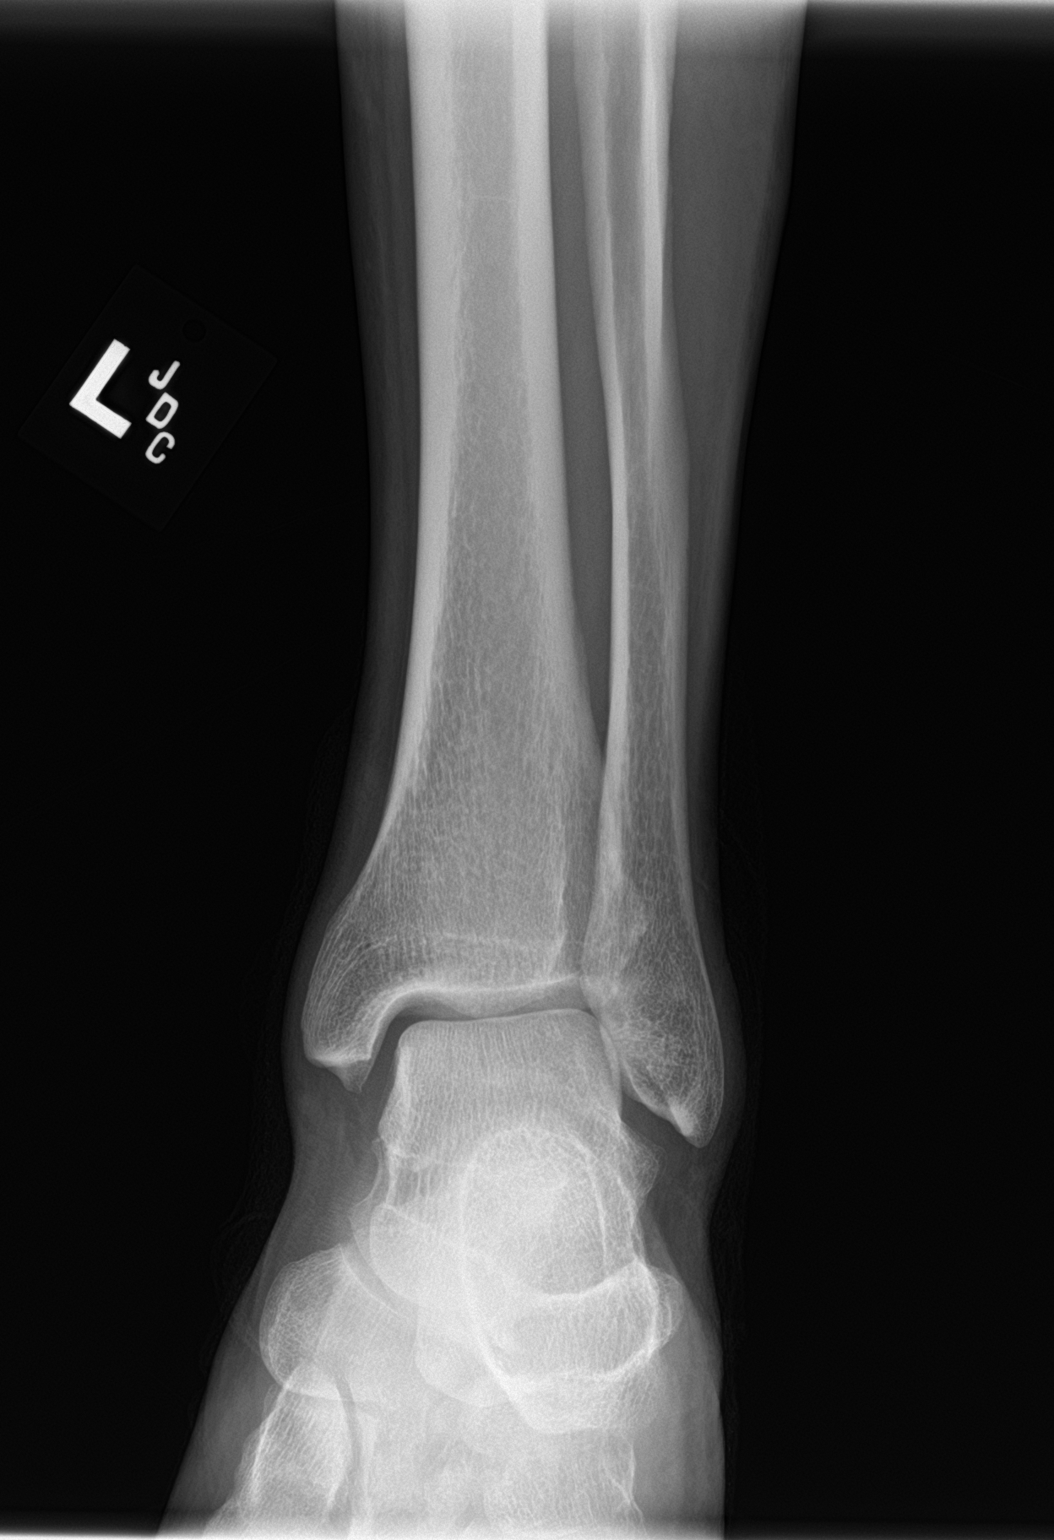
[im 2/3]
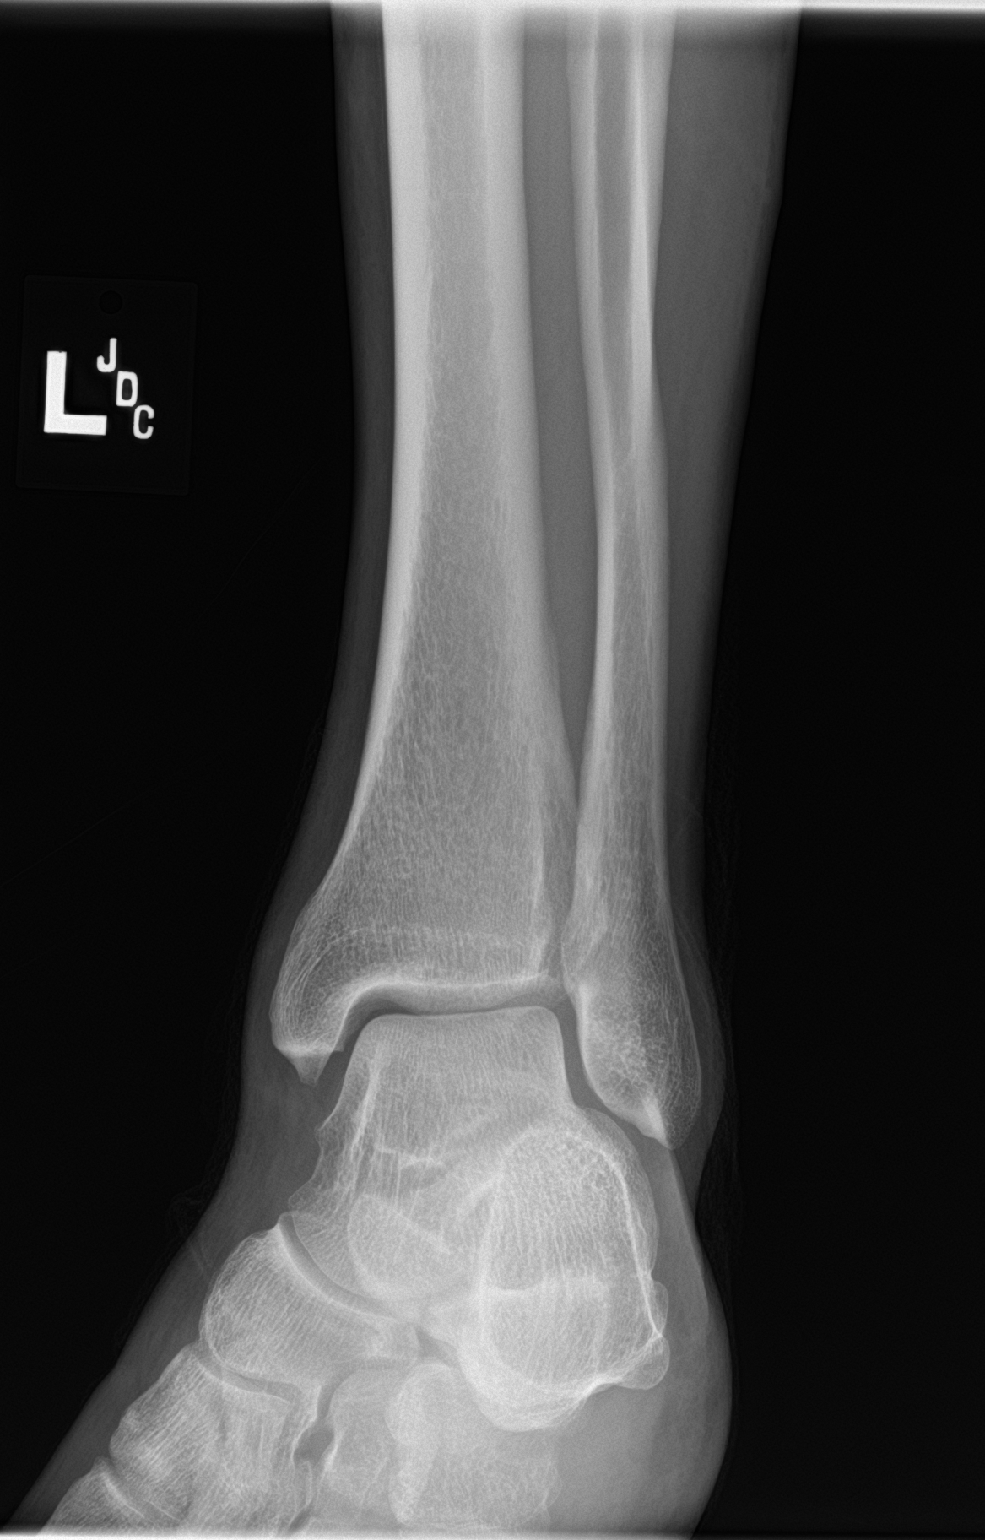
[im 3/3]
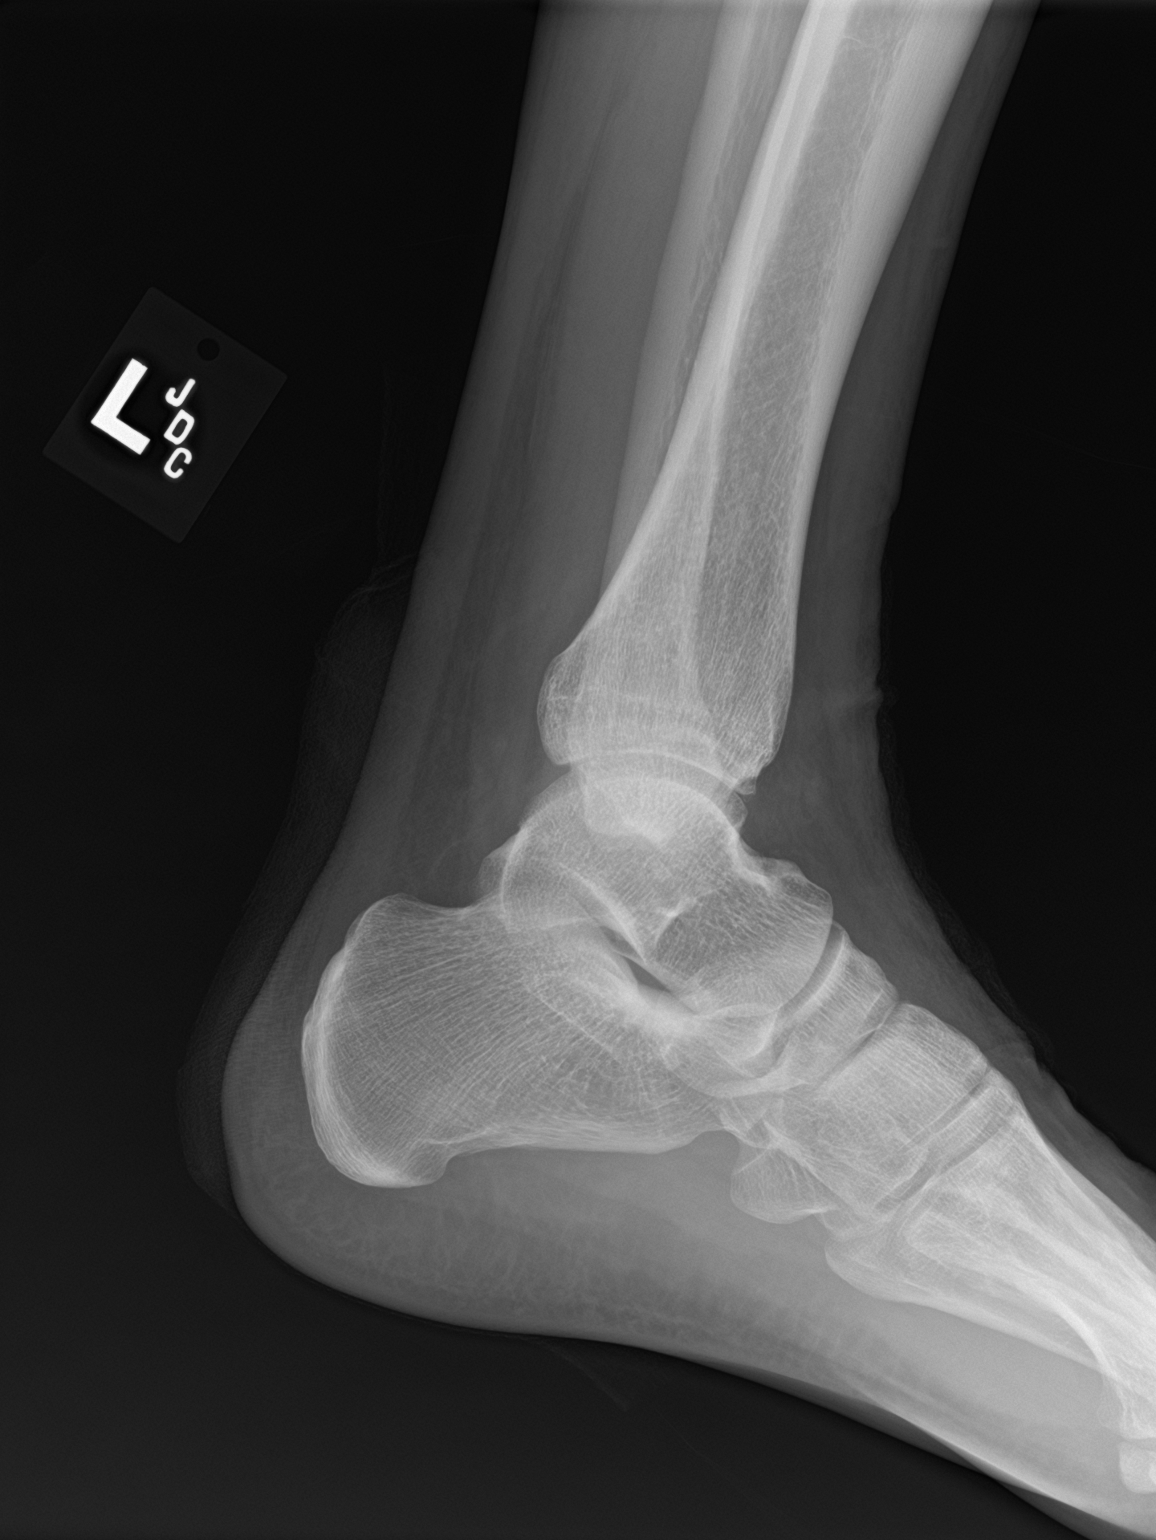

[3 of 3 positions shown; findings below may reference images not displayed]

FINDINGS: There is no evidence of fracture, dislocation, or joint effusion.
The ankle mortise is preserved. There is no evidence of arthropathy
or other focal bone abnormality. Dressing overlies the posterior
ankle, mild skin irregularity subjacent to the dressing. No
radiopaque foreign body or soft tissue gas.
IMPRESSION: Soft tissue injury to the posterior ankle with overlying dressing in
place. No radiopaque foreign body or acute osseous abnormality.

## 2024-02-03 ENCOUNTER — Other Ambulatory Visit: Payer: Self-pay | Admitting: Family Medicine

## 2024-02-03 DIAGNOSIS — F411 Generalized anxiety disorder: Secondary | ICD-10-CM

## 2024-02-03 DIAGNOSIS — Z8249 Family history of ischemic heart disease and other diseases of the circulatory system: Secondary | ICD-10-CM

## 2024-02-10 ENCOUNTER — Ambulatory Visit
Admission: RE | Admit: 2024-02-10 | Discharge: 2024-02-10 | Disposition: A | Discharge status: Home / Self Care | Payer: Self-pay | Source: Ambulatory Visit | Attending: Family Medicine | Admitting: Family Medicine

## 2024-02-10 DIAGNOSIS — F411 Generalized anxiety disorder: Secondary | ICD-10-CM | POA: Insufficient documentation

## 2024-02-10 DIAGNOSIS — Z8249 Family history of ischemic heart disease and other diseases of the circulatory system: Secondary | ICD-10-CM | POA: Insufficient documentation

## 2024-05-05 ENCOUNTER — Other Ambulatory Visit: Payer: Self-pay | Admitting: Family Medicine

## 2024-05-05 DIAGNOSIS — R911 Solitary pulmonary nodule: Secondary | ICD-10-CM

## 2024-05-18 ENCOUNTER — Other Ambulatory Visit

## 2024-05-25 ENCOUNTER — Ambulatory Visit
Admission: RE | Admit: 2024-05-25 | Discharge: 2024-05-25 | Disposition: A | Discharge status: Home / Self Care | Source: Ambulatory Visit | Attending: Family Medicine | Admitting: Family Medicine

## 2024-05-25 DIAGNOSIS — R911 Solitary pulmonary nodule: Secondary | ICD-10-CM

## 2024-06-08 ENCOUNTER — Ambulatory Visit: Admitting: Dermatology

## 2024-06-08 ENCOUNTER — Encounter: Payer: Self-pay | Admitting: Dermatology

## 2024-06-08 DIAGNOSIS — B079 Viral wart, unspecified: Secondary | ICD-10-CM

## 2024-06-08 DIAGNOSIS — C44519 Basal cell carcinoma of skin of other part of trunk: Secondary | ICD-10-CM | POA: Diagnosis not present

## 2024-06-08 DIAGNOSIS — L570 Actinic keratosis: Secondary | ICD-10-CM | POA: Diagnosis not present

## 2024-06-08 DIAGNOSIS — L578 Other skin changes due to chronic exposure to nonionizing radiation: Secondary | ICD-10-CM | POA: Diagnosis not present

## 2024-06-08 DIAGNOSIS — C44612 Basal cell carcinoma of skin of right upper limb, including shoulder: Secondary | ICD-10-CM | POA: Diagnosis not present

## 2024-06-08 DIAGNOSIS — I781 Nevus, non-neoplastic: Secondary | ICD-10-CM

## 2024-06-08 DIAGNOSIS — L814 Other melanin hyperpigmentation: Secondary | ICD-10-CM

## 2024-06-08 DIAGNOSIS — D492 Neoplasm of unspecified behavior of bone, soft tissue, and skin: Secondary | ICD-10-CM | POA: Diagnosis not present

## 2024-06-08 DIAGNOSIS — W908XXA Exposure to other nonionizing radiation, initial encounter: Secondary | ICD-10-CM

## 2024-06-08 DIAGNOSIS — Z7189 Other specified counseling: Secondary | ICD-10-CM

## 2024-06-08 DIAGNOSIS — L821 Other seborrheic keratosis: Secondary | ICD-10-CM

## 2024-06-08 DIAGNOSIS — D229 Melanocytic nevi, unspecified: Secondary | ICD-10-CM

## 2024-06-08 DIAGNOSIS — C4491 Basal cell carcinoma of skin, unspecified: Secondary | ICD-10-CM

## 2024-06-08 DIAGNOSIS — Z1283 Encounter for screening for malignant neoplasm of skin: Secondary | ICD-10-CM | POA: Diagnosis not present

## 2024-06-08 DIAGNOSIS — D1801 Hemangioma of skin and subcutaneous tissue: Secondary | ICD-10-CM

## 2024-06-08 HISTORY — DX: Basal cell carcinoma of skin, unspecified: C44.91

## 2024-06-08 NOTE — Patient Instructions (Addendum)

## 2024-06-08 NOTE — Progress Notes (Unsigned)
 Follow-Up Visit   Subjective  Derrick Holloway is a 48 y.o. male who presents for the following: Skin Cancer Screening and Full Body Skin Exam, hx of Aks, check spots L side, chest, no symptoms  The patient presents for Total-Body Skin Exam (TBSE) for skin cancer screening and mole check. The patient has spots, moles and lesions to be evaluated, some may be new or changing and the patient may have concern these could be cancer.  The following portions of the chart were reviewed this encounter and updated as appropriate: medications, allergies, medical history  Review of Systems:  No other skin or systemic complaints except as noted in HPI or Assessment and Plan.  Objective  Well appearing patient in no apparent distress; mood and affect are within normal limits.  A full examination was performed including scalp, head, eyes, ears, nose, lips, neck, chest, axillae, abdomen, back, buttocks, bilateral upper extremities, bilateral lower extremities, hands, feet, fingers, toes, fingernails, and toenails. All findings within normal limits unless otherwise noted below.   Relevant physical exam findings are noted in the Assessment and Plan.  L ear x 3 (3) Pink scaly macules R tricep Crusted red patch 1.5cm  L side Crusted patch 1.8 x 0.8cm  L elbow x 1 Verrucous papules -- Discussed viral etiology and contagion.   Assessment & Plan   SKIN CANCER SCREENING PERFORMED TODAY.  ACTINIC DAMAGE - Chronic condition, secondary to cumulative UV/sun exposure - diffuse scaly erythematous macules with underlying dyspigmentation - Recommend daily broad spectrum sunscreen SPF 30+ to sun-exposed areas, reapply every 2 hours as needed.  - Staying in the shade or wearing long sleeves, sun glasses (UVA+UVB protection) and wide brim hats (4-inch brim around the entire circumference of the hat) are also recommended for sun protection.  - Call for new or changing lesions.  LENTIGINES, SEBORRHEIC  KERATOSES, HEMANGIOMAS - Benign normal skin lesions - Benign-appearing - Call for any changes  MELANOCYTIC NEVI - Tan-brown and/or pink-flesh-colored symmetric macules and papules - Benign appearing on exam today - Observation - Call clinic for new or changing moles - Recommend daily use of broad spectrum spf 30+ sunscreen to sun-exposed areas.   TELANGIECTASIAS Cheeks, nose Exam: dilated blood vessel(s) cheeks, nose Treatment Plan: Benign appearing on exam Call for changes Counseling for BBL / IPL / Laser and Coordination of Care Discussed the treatment option of Broad Band Light (BBL) /Intense Pulsed Light (IPL)/ Laser for skin discoloration, including brown spots and redness.  Typically we recommend at least 1-3 treatment sessions about 5-8 weeks apart for best results.  Cannot have tanned skin when BBL performed, and regular use of sunscreen/photoprotection is advised after the procedure to help maintain results. The patient's condition may also require maintenance treatments in the future.  The fee for BBL / laser treatments is $350 per treatment session for the whole face.  A fee can be quoted for other parts of the body.  Insurance typically does not pay for BBL/laser treatments and therefore the fee is an out-of-pocket cost. Recommend prophylactic valtrex treatment. Once scheduled for procedure, will send Rx in prior to patient's appointment.   AK (ACTINIC KERATOSIS) (3) L ear x 3 (3) Actinic keratoses are precancerous spots that appear secondary to cumulative UV radiation exposure/sun exposure over time. They are chronic with expected duration over 1 year. A portion of actinic keratoses will progress to squamous cell carcinoma of the skin. It is not possible to reliably predict which spots will progress to skin  cancer and so treatment is recommended to prevent development of skin cancer.  Recommend daily broad spectrum sunscreen SPF 30+ to sun-exposed areas, reapply every 2 hours  as needed.  Recommend staying in the shade or wearing long sleeves, sun glasses (UVA+UVB protection) and wide brim hats (4-inch brim around the entire circumference of the hat). Call for new or changing lesions. Destruction of lesion - L ear x 3 (3) Complexity: simple   Destruction method: cryotherapy   Informed consent: discussed and consent obtained   Timeout:  patient name, date of birth, surgical site, and procedure verified Lesion destroyed using liquid nitrogen: Yes   Region frozen until ice ball extended beyond lesion: Yes   Outcome: patient tolerated procedure well with no complications   Post-procedure details: wound care instructions given    NEOPLASM OF SKIN (2) R tricep Epidermal / dermal shaving  Lesion diameter (cm):  1.5 Informed consent: discussed and consent obtained   Timeout: patient name, date of birth, surgical site, and procedure verified   Procedure prep:  Patient was prepped and draped in usual sterile fashion Prep type:  Isopropyl alcohol Anesthesia: the lesion was anesthetized in a standard fashion   Anesthetic:  1% lidocaine w/ epinephrine 1-100,000 buffered w/ 8.4% NaHCO3 Instrument used: flexible razor blade   Hemostasis achieved with: pressure, aluminum chloride and electrodesiccation   Outcome: patient tolerated procedure well   Post-procedure details: sterile dressing applied and wound care instructions given   Dressing type: bandage and bacitracin    Destruction of lesion Complexity: extensive   Destruction method: electrodesiccation and curettage   Informed consent: discussed and consent obtained   Timeout:  patient name, date of birth, surgical site, and procedure verified Procedure prep:  Patient was prepped and draped in usual sterile fashion Prep type:  Isopropyl alcohol Anesthesia: the lesion was anesthetized in a standard fashion   Anesthetic:  1% lidocaine w/ epinephrine 1-100,000 buffered w/ 8.4% NaHCO3 Curettage performed in three  different directions: Yes   Electrodesiccation performed over the curetted area: Yes   Lesion length (cm):  1.5 Lesion width (cm):  1.5 Margin per side (cm):  0.2 Final wound size (cm):  1.9 Hemostasis achieved with:  pressure, aluminum chloride and electrodesiccation Outcome: patient tolerated procedure well with no complications   Post-procedure details: sterile dressing applied and wound care instructions given   Dressing type: bandage and bacitracin    Specimen 1 - Surgical pathology Differential Diagnosis: R/O BCC  Check Margins: yes Crusted red patch 1.5cm EDC L side Skin / nail biopsy Type of biopsy: tangential   Informed consent: discussed and consent obtained   Timeout: patient name, date of birth, surgical site, and procedure verified   Procedure prep:  Patient was prepped and draped in usual sterile fashion Prep type:  Isopropyl alcohol Anesthesia: the lesion was anesthetized in a standard fashion   Anesthetic:  1% lidocaine w/ epinephrine 1-100,000 buffered w/ 8.4% NaHCO3 Instrument used: flexible razor blade   Outcome: patient tolerated procedure well   Post-procedure details: sterile dressing applied and wound care instructions given   Dressing type: bandage and bacitracin    Specimen 2 - Surgical pathology Differential Diagnosis: R/O BCC  Check Margins: No Crusted patch 1.8 x 0.8cm VIRAL WARTS, UNSPECIFIED TYPE L elbow x 1 Viral Wart (HPV) Counseling  Discussed viral / HPV (Human Papilloma Virus) etiology and risk of spread /infectivity to other areas of body as well as to other people.  Multiple treatments and methods may be required to  clear warts and it is possible treatment may not be successful.  Treatment risks include discoloration; scarring and there is still potential for wart recurrence. Destruction of lesion - L elbow x 1 Complexity: simple   Destruction method: cryotherapy   Informed consent: discussed and consent obtained   Timeout:  patient  name, date of birth, surgical site, and procedure verified Lesion destroyed using liquid nitrogen: Yes   Region frozen until ice ball extended beyond lesion: Yes   Outcome: patient tolerated procedure well with no complications   Post-procedure details: wound care instructions given    SKIN CANCER SCREENING   ACTINIC SKIN DAMAGE   LENTIGO   MELANOCYTIC NEVUS, UNSPECIFIED LOCATION   TELANGIECTASIA   COUNSELING AND COORDINATION OF CARE   Return in about 8 months (around 02/06/2025) for AK f/u, bx f/u.  I, Grayce Saunas, RMA, am acting as scribe for Alm Rhyme, MD .   Documentation: I have reviewed the above documentation for accuracy and completeness, and I agree with the above.  Alm Rhyme, MD

## 2024-06-09 LAB — SURGICAL PATHOLOGY

## 2024-06-10 ENCOUNTER — Encounter: Payer: Self-pay | Admitting: Dermatology

## 2024-06-10 ENCOUNTER — Ambulatory Visit: Payer: Self-pay | Admitting: Dermatology

## 2024-06-10 NOTE — Telephone Encounter (Addendum)
 Called and discussed bx results with patient. He verbalized understanding and denied further questions. Patient scheduled for surgery to remove bcc  at left side with Dr. Claudene on 07/21/2024 at 8:30 am   ----- Message from Derrick Holloway sent at 06/10/2024 10:58 AM EDT ----- FINAL DIAGNOSIS        1. Skin, R tricep :       SUPERFICIAL BASAL CELL CARCINOMA, CLOSE TO MARGIN        2. Skin, L side :       BASAL CELL CARCINOMA, SUPERFICIAL AND NODULAR PATTERNS, PERIPHERAL AND DEEP       MARGINS INVOLVED   1- Cancer = BCC Superficial Already treated Recheck next visit 2- Cancer = BCC Schedule surgery (with Dr Jackquline or Dr Claudene or Dr Corey - since I am not doing surgery since my spinal neck surgery) ----- Message ----- From: Interface, [facility] In [facility] Sent: 06/09/2024   6:42 PM EDT To: Derrick JAYSON Rhyme, MD

## 2024-07-21 ENCOUNTER — Ambulatory Visit: Admitting: Dermatology

## 2024-07-21 ENCOUNTER — Encounter: Payer: Self-pay | Admitting: Dermatology

## 2024-07-21 DIAGNOSIS — C44519 Basal cell carcinoma of skin of other part of trunk: Secondary | ICD-10-CM | POA: Diagnosis not present

## 2024-07-21 MED ORDER — MUPIROCIN 2 % EX OINT: 1.0000 | TOPICAL_OINTMENT | Freq: Every day | CUTANEOUS | 1 refills | Status: AC

## 2024-07-21 NOTE — Progress Notes (Unsigned)
   Follow-Up Visit   Subjective  Derrick Holloway is a 48 y.o. male who presents for the following: Bx proven BCC of the L side   The following portions of the chart were reviewed this encounter and updated as appropriate: medications, allergies, medical history  Review of Systems:  No other skin or systemic complaints except as noted in HPI or Assessment and Plan.  Objective  Well appearing patient in no apparent distress; mood and affect are within normal limits.    A focused examination was performed of the following areas: Left side  Relevant exam findings are noted in the Assessment and Plan.  L side Pink bx site  Assessment & Plan   BASAL CELL CARCINOMA (BCC) OF SKIN OF OTHER PART OF TORSO L side Skin excision  Excision method:  elliptical Lesion length (cm):  0.8 Lesion width (cm):  1.5 Margin per side (cm):  0.4 Total excision diameter (cm):  1.6 Informed consent: discussed and consent obtained   Timeout: patient name, date of birth, surgical site, and procedure verified   Procedure prep:  Patient was prepped and draped in usual sterile fashion Prep type:  Chlorhexidine Anesthesia: the lesion was anesthetized in a standard fashion   Anesthetic:  1% lidocaine w/ epinephrine 1-100,000 buffered w/ 8.4% NaHCO3 (18cc lido w/ epi) Instrument used: #15 blade   Hemostasis achieved with: suture, pressure and electrodesiccation   Outcome: patient tolerated procedure well with no complications    Skin repair Complexity:  Intermediate Final length (cm):  5 Informed consent: discussed and consent obtained   Timeout: patient name, date of birth, surgical site, and procedure verified   Procedure prep:  Patient was prepped and draped in usual sterile fashion Prep type:  Chlorhexidine Anesthesia: the lesion was anesthetized in a standard fashion   Anesthetic:  1% lidocaine w/ epinephrine 1-100,000 buffered w/ 8.4% NaHCO3 Reason for type of repair: reduce tension to  allow closure, reduce the risk of dehiscence, infection, and necrosis, reduce subcutaneous dead space and avoid a hematoma, allow closure of the large defect and preserve normal anatomy   Undermining: edges could be approximated without difficulty   Subcutaneous layers (deep stitches):  Suture size:  3-0 Suture type: Monocryl (poliglecaprone 25)   Stitches:  Buried vertical mattress Fine/surface layer approximation (top stitches):  Suture size:  4-0 Suture type: Prolene (polypropylene)   Stitches comment:  Running locked Suture removal (days):  7 Hemostasis achieved with: suture, pressure and electrodesiccation Outcome: patient tolerated procedure well with no complications   Post-procedure details: sterile dressing applied and wound care instructions given   Dressing type: bandage and pressure dressing (Mupirocin )    mupirocin  ointment (BACTROBAN ) 2 % Apply 1 Application topically daily. qd to excision site Specimen 1 - Surgical pathology Differential Diagnosis: Bx proven BCC  Check Margins: yes Pink bx site 1.5cm [id number] Lateral tag Bx proven  Start Mupirocin  oint qd to excision site Return in 1 week (on 07/28/2024) for suture removal.  I, Sonya Hupman, RMA, am acting as scribe for Boneta Sharps, MD .   Documentation: I have reviewed the above documentation for accuracy and completeness, and I agree with the above.  Boneta Sharps, MD

## 2024-07-21 NOTE — Patient Instructions (Signed)
 Wound Care Instructions  On the day following your surgery, you should begin doing daily dressing changes: Remove the old dressing and discard it. Cleanse the wound gently with tap water. This may be done in the shower or by placing a wet gauze pad directly on the wound and letting it soak for several minutes. It is important to gently remove any dried blood from the wound in order to encourage healing. This may be done by gently rolling a moistened Q-tip on the dried blood. Do not pick at the wound. If the wound should start to bleed, continue cleaning the wound, then place a moist gauze pad on the wound and hold pressure for a few minutes.  Make sure you then dry the skin surrounding the wound completely or the tape will not stick to the skin. Do not use cotton balls on the wound. After the wound is clean and dry, apply the ointment gently with a Q-tip. Cut a non-stick pad to fit the size of the wound. Lay the pad flush to the wound. If the wound is draining, you may want to reinforce it with a small amount of gauze on top of the non-stick pad for a little added compression to the area. Use the tape to seal the area completely. Select from the following with respect to your individual situation: If your wound has been stitched closed: continue the above steps 1-8 at least daily until your sutures are removed. If your wound has been left open to heal: continue steps 1-8 at least daily for the first 3-4 weeks. We would like for you to take a few extra precautions for at least the next week. Sleep with your head elevated on pillows if our wound is on your head. Do not bend over or lift heavy items to reduce the chance of elevated blood pressure to the wound Do not participate in particularly strenuous activities.   Below is a list of dressing supplies you might need.  Cotton-tipped applicators - Q-tips Gauze pads (2x2 and/or 4x4) - All-Purpose Sponges Non-stick dressing material - Telfa Tape -  Paper or Hypafix New and clean tube of petroleum jelly - Vaseline    Comments on Post-Operative Period Slight swelling and redness often appear around the wound. This is normal and will disappear within several days following the surgery. The healing wound will drain a brownish-red-yellow discharge during healing. This is a normal phase of wound healing. As the wound begins to heal, the drainage may increase in amount. Again, this drainage is normal. Notify us  if the drainage becomes persistently bloody, excessively swollen, or intensely painful or develops a foul odor or red streaks.  If you should experience mild discomfort during the healing phase, you may take an aspirin-free medication such as Tylenol  (acetaminophen ). Notify us  if the discomfort is severe or persistent. Avoid alcoholic beverages when taking pain medicine.  In Case of Wound Hemorrhage A wound hemorrhage is when the bandage suddenly becomes soaked with bright red blood and flows profusely. If this happens, sit down or lie down with your head elevated. If the wound has a dressing on it, do not remove the dressing. Apply pressure to the existing gauze. If the wound is not covered, use a gauze pad to apply pressure and continue applying the pressure for 20 minutes without peeking. DO NOT COVER THE WOUND WITH A LARGE TOWEL OR WASH CLOTH. Release your hand from the wound site but do not remove the dressing. If the bleeding has stopped,  gently clean around the wound. Leave the dressing in place for 24 hours if possible. This wait time allows the blood vessels to close off so that you do not spark a new round of bleeding by disrupting the newly clotted blood vessels with an immediate dressing change. If the bleeding does not subside, continue to hold pressure. If matters are out of your control, contact an After Hours clinic or go to the Emergency Room.     Due to recent changes in healthcare laws, you may see results of your pathology  and/or laboratory studies on MyChart before the doctors have had a chance to review them. We understand that in some cases there may be results that are confusing or concerning to you. Please understand that not all results are received at the same time and often the doctors may need to interpret multiple results in order to provide you with the best plan of care or course of treatment. Therefore, we ask that you please give us  2 business days to thoroughly review all your results before contacting the office for clarification. Should we see a critical lab result, you will be contacted sooner.   If You Need Anything After Your Visit  If you have any questions or concerns for your doctor, please call our main line at [phone] and press option 4 to reach your doctor's medical assistant. If no one answers, please leave a voicemail as directed and we will return your call as soon as possible. Messages left after 4 pm will be answered the following business day.   You may also send us  a message via MyChart. We typically respond to MyChart messages within 1-2 business days.  For prescription refills, please ask your pharmacy to contact our office. Our fax number is [phone].  If you have an urgent issue when the clinic is closed that cannot wait until the next business day, you can page your doctor at the number below.    Please note that while we do our best to be available for urgent issues outside of office hours, we are not available 24/7.   If you have an urgent issue and are unable to reach us , you may choose to seek medical care at your doctor's office, retail clinic, urgent care center, or emergency room.  If you have a medical emergency, please immediately call 911 or go to the emergency department.  Pager Numbers  - Dr. Hester: [phone]  - Dr. Jackquline: [phone]  - Dr. Claudene: [phone]   - Dr. Raymund: [phone]  In the event of inclement weather, please call our  main line at [phone] for an update on the status of any delays or closures.  Dermatology Medication Tips: Please keep the boxes that topical medications come in in order to help keep track of the instructions about where and how to use these. Pharmacies typically print the medication instructions only on the boxes and not directly on the medication tubes.   If your medication is too expensive, please contact our office at [phone] option 4 or send us  a message through MyChart.   We are unable to tell what your co-pay for medications will be in advance as this is different depending on your insurance coverage. However, we may be able to find a substitute medication at lower cost or fill out paperwork to get insurance to cover a needed medication.   If a prior authorization is required to get your medication covered by your insurance company, please allow us   1-2 business days to complete this process.  Drug prices often vary depending on where the prescription is filled and some pharmacies may offer cheaper prices.  The website www.goodrx.com contains coupons for medications through different pharmacies. The prices here do not account for what the cost may be with help from insurance (it may be cheaper with your insurance), but the website can give you the price if you did not use any insurance.  - You can print the associated coupon and take it with your prescription to the pharmacy.  - You may also stop by our office during regular business hours and pick up a GoodRx coupon card.  - If you need your prescription sent electronically to a different pharmacy, notify our office through [facility] or by phone at [phone] option 4.     Si Usted Necesita Algo Despus de Su Visita  Tambin puede enviarnos un mensaje a travs de Clinical cytogeneticist. Por lo general respondemos a los mensajes de MyChart en el transcurso de 1 a 2 das hbiles.  Para renovar recetas, por favor pida a su  farmacia que se ponga en contacto con nuestra oficina. Randi lakes de fax es [place] [phone].  Si tiene un asunto urgente cuando la clnica est cerrada y que no puede esperar hasta el siguiente da hbil, puede llamar/localizar a su doctor(a) al nmero que aparece a continuacin.   Por favor, tenga en cuenta que aunque hacemos todo lo posible para estar disponibles para asuntos urgentes fuera del horario de [place], no estamos disponibles las 24 horas del da, los 7 [address]  [address] de la [place].   Si tiene un problema urgente y no puede comunicarse con nosotros, puede optar por buscar atencin mdica  en el consultorio de su doctor(a), en una clnica privada, en un centro de atencin urgente o en una sala de emergencias.  Si tiene Engineer, drilling, por favor llame inmediatamente al 911 o vaya a la sala de emergencias.  Nmeros de bper  - Dr. Hester: [phone]  - Dra. Jackquline: [phone]  - Dr. Claudene: [phone]  - Dra. Kitts: [phone]  En caso de inclemencias del [place], por favor llame a nuestra lnea principal al [phone] para una actualizacin sobre el estado de cualquier retraso o cierre.  Consejos para la medicacin en dermatologa: Por favor, guarde las cajas en las que vienen los medicamentos de uso tpico para ayudarle a seguir las instrucciones sobre dnde y cmo usarlos. Las farmacias generalmente imprimen las instrucciones del medicamento slo en las cajas y no directamente en los tubos del [place].   Si su medicamento es muy caro, por favor, pngase en contacto con landry rieger llamando al [phone] y presione la opcin 4 o envenos un mensaje a travs de Clinical cytogeneticist.   No podemos decirle cul ser su copago por los medicamentos por adelantado ya que esto es diferente dependiendo de la cobertura de su seguro. Sin embargo, es posible que podamos encontrar un medicamento sustituto a Audiological scientist un formulario para que el seguro cubra el medicamento  que se considera necesario.   Si se requiere una autorizacin previa para que su compaa de seguros malta su medicamento, por favor permtanos de 1 a 2 das hbiles para completar este proceso.  Los precios de los medicamentos varan con frecuencia dependiendo del Environmental consultant de dnde se surte la receta y alguna farmacias pueden ofrecer precios ms baratos.  El sitio web www.goodrx.com tiene cupones para medicamentos de Health and safety inspector. Los precios aqu no tienen en  cuenta lo que podra costar con la ayuda del seguro (puede ser ms barato con su seguro), pero el sitio web puede darle el precio si no Visual merchandiser.  - Puede imprimir el cupn correspondiente y llevarlo con su receta a la farmacia.  - Tambin puede pasar por nuestra oficina durante el horario de atencin regular y Education officer, museum una tarjeta de cupones de GoodRx.  - Si necesita que su receta se enve electrnicamente a una farmacia diferente, informe a nuestra oficina a travs de MyChart de [place] o por telfono llamando al [phone] y presione la opcin 4.

## 2024-07-22 ENCOUNTER — Telehealth: Payer: Self-pay

## 2024-07-22 ENCOUNTER — Encounter: Payer: Self-pay | Admitting: Dermatology

## 2024-07-22 LAB — SURGICAL PATHOLOGY

## 2024-07-22 NOTE — Telephone Encounter (Signed)
Patient doing well after yesterdays surgery./sh 

## 2024-07-23 ENCOUNTER — Ambulatory Visit: Payer: Self-pay | Admitting: Dermatology

## 2024-07-26 NOTE — Telephone Encounter (Signed)
 Discussed pathology results. Patient reports surgical wound is healing well.

## 2024-07-26 NOTE — Telephone Encounter (Signed)
-----   Message from [place] sent at 07/23/2024  6:03 PM EDT ----- Diagnosis L side :       RESIDUAL BASAL CELL CARCINOMA, MARGINS FREE    Please call to share that excision fully removed basal cell skin cancer and get update on surgical wound. Thank you. ----- Message ----- From: Interface, [facility] Sent: 07/22/2024   7:34 PM EDT To: Boneta Sharps, MD

## 2024-07-29 ENCOUNTER — Encounter: Payer: Self-pay | Admitting: Dermatology

## 2024-07-29 ENCOUNTER — Ambulatory Visit: Admitting: Dermatology

## 2024-07-29 DIAGNOSIS — Z5189 Encounter for other specified aftercare: Secondary | ICD-10-CM

## 2024-07-29 DIAGNOSIS — Z4802 Encounter for removal of sutures: Secondary | ICD-10-CM

## 2024-07-29 DIAGNOSIS — Z48817 Encounter for surgical aftercare following surgery on the skin and subcutaneous tissue: Secondary | ICD-10-CM

## 2024-07-29 NOTE — Progress Notes (Signed)
   Follow-Up Visit   Subjective  Derrick Holloway is a 48 y.o. male who presents for the following: Suture removal  Pathology showed residual BCC, margins gree.  The following portions of the chart were reviewed this encounter and updated as appropriate: medications, allergies, medical history  Review of Systems:  No other skin or systemic complaints except as noted in HPI or Assessment and Plan.  Objective  Well appearing patient in no apparent distress; mood and affect are within normal limits.  Areas Examined: torso Relevant physical exam findings are noted in the Assessment and Plan.    Assessment & Plan   VISIT FOR WOUND CHECK   ENCOUNTER FOR REMOVAL OF SUTURES   Encounter for Removal of Sutures - Incision site is clean, dry and intact. - Wound cleansed, sutures removed, wound cleansed and steri strips applied.  - Discussed pathology results showing residual BCC, margins free. - Patient advised to keep steri-strips dry until they fall off. - Scars remodel for a full year. - Once steri-strips fall off, patient can apply over-the-counter silicone scar cream once to twice a day to help with scar remodeling if desired. - Patient advised to call with any concerns or if they notice any new or changing lesions.  No follow-ups on file.  I, Emerick Ege, CMA am acting as scribe for Boneta Sharps, MD.    Documentation: I have reviewed the above documentation for accuracy and completeness, and I agree with the above.  Boneta Sharps, MD

## 2024-08-02 ENCOUNTER — Encounter: Payer: Self-pay | Admitting: Dermatology

## 2025-02-07 ENCOUNTER — Ambulatory Visit: Admitting: Dermatology
